# Patient Record
Sex: Female | Born: 1945 | Race: White | Hispanic: No | Smoking: Never smoker
Health system: Southern US, Community
[De-identification: ages and names within clinical notes are randomized; demographics above are authoritative.]

## PROBLEM LIST (undated history)

## (undated) DIAGNOSIS — E119 Type 2 diabetes mellitus without complications: Secondary | ICD-10-CM

## (undated) HISTORY — PX: APPENDECTOMY: SHX54

## (undated) HISTORY — PX: REPLACEMENT TOTAL KNEE: SUR1224

## (undated) HISTORY — PX: TUBAL LIGATION: SHX77

## (undated) HISTORY — PX: TONSILLECTOMY: SUR1361

## (undated) HISTORY — PX: CHOLECYSTECTOMY: SHX55

## (undated) HISTORY — PX: SHOULDER SURGERY: SHX246

---

## 2020-07-10 ENCOUNTER — Other Ambulatory Visit: Payer: Self-pay

## 2020-07-10 ENCOUNTER — Emergency Department (HOSPITAL_BASED_OUTPATIENT_CLINIC_OR_DEPARTMENT_OTHER): Payer: Medicare Other

## 2020-07-10 ENCOUNTER — Emergency Department (HOSPITAL_BASED_OUTPATIENT_CLINIC_OR_DEPARTMENT_OTHER)
Admission: EM | Admit: 2020-07-10 | Discharge: 2020-07-10 | Disposition: A | Discharge status: Home / Self Care | Payer: Medicare Other | Attending: Emergency Medicine | Admitting: Emergency Medicine

## 2020-07-10 ENCOUNTER — Encounter (HOSPITAL_BASED_OUTPATIENT_CLINIC_OR_DEPARTMENT_OTHER): Payer: Self-pay | Admitting: Emergency Medicine

## 2020-07-10 DIAGNOSIS — E119 Type 2 diabetes mellitus without complications: Secondary | ICD-10-CM | POA: Insufficient documentation

## 2020-07-10 DIAGNOSIS — M25511 Pain in right shoulder: Secondary | ICD-10-CM | POA: Diagnosis present

## 2020-07-10 DIAGNOSIS — Z96651 Presence of right artificial knee joint: Secondary | ICD-10-CM | POA: Insufficient documentation

## 2020-07-10 DIAGNOSIS — X501XXA Overexertion from prolonged static or awkward postures, initial encounter: Secondary | ICD-10-CM | POA: Insufficient documentation

## 2020-07-10 HISTORY — DX: Type 2 diabetes mellitus without complications: E11.9

## 2020-07-10 LAB — CBG MONITORING, ED: Glucose-Capillary: 112 mg/dL — ABNORMAL HIGH (ref 70–99)

## 2020-07-10 MED ORDER — HYDROCODONE-ACETAMINOPHEN 5-325 MG PO TABS
1.0000 | ORAL_TABLET | Freq: Once | ORAL | Status: DC
  Filled 2020-07-10: qty 1

## 2020-07-10 MED ORDER — LIDOCAINE 5 % EX PTCH
1.0000 | MEDICATED_PATCH | CUTANEOUS | Status: DC
  Administered 2020-07-10: 1 via TRANSDERMAL
  Filled 2020-07-10: qty 1

## 2020-07-10 MED ORDER — HYDROCODONE-ACETAMINOPHEN 5-325 MG PO TABS: 1.0000 | ORAL_TABLET | Freq: Four times a day (QID) | ORAL | 0 refills | Status: AC | PRN

## 2020-07-10 NOTE — ED Notes (Signed)
ED Provider at bedside, pt hyperventilating, states tingling in hands and legs, states "seeing gray".

## 2020-07-10 NOTE — ED Provider Notes (Addendum)
[facility] EMERGENCY DEPARTMENT Provider Note   CSN: [id number] Arrival date & time: 07/10/20  9735     History Chief Complaint  Patient presents with   Shoulder Pain    Courtney Stanley is a 76 y.o. female with past medical history of primary osteoarthritis and left shoulder surgery who presents the ED with complaints of right shoulder pain x1 month.  I reviewed patient's medical record and she is followed by Dr. Leonel Stanley with [facility].    On my examination, patient reports that she developed right shoulder pain after lifting a mixer above her head out of a cabinet 1 month ago.  Since then she has had repeated pain with overhead arm movement.  Last evening she was taking her shirt off when she developed a sudden worsening in her right shoulder pain.  She could hardly sleep last night due to the pain.  She took 800 mg ibuprofen prior to arrival, with some relief.  She denies any numbness, weakness, falls, fevers or chills, history of autoimmune disease or illicit drug use, or other injuries.  HPI     Past Medical History:  Diagnosis Date   Diabetes mellitus without complication (HCC)     There are no problems to display for this patient.   Past Surgical History:  Procedure Laterality Date   APPENDECTOMY     CHOLECYSTECTOMY     REPLACEMENT TOTAL KNEE Right    SHOULDER SURGERY Left    TONSILLECTOMY     TUBAL LIGATION       OB History   No obstetric history on file.     No family history on file.  Social History   Tobacco Use   Smoking status: Never   Smokeless tobacco: Never  Vaping Use   Vaping Use: Never used  Substance Use Topics   Alcohol use: Not Currently   Drug use: Not Currently    Home Medications Prior to Admission medications   Medication Sig Start Date End Date Taking? Authorizing Provider  HYDROcodone-acetaminophen (NORCO/VICODIN) 5-325 MG tablet Take 1 tablet by mouth every 6 (six) hours as needed for severe pain. 07/10/20  Yes  Courtney New, PA-C    Allergies    Patient has no known allergies.  Review of Systems   Review of Systems  All other systems reviewed and are negative.  Physical Exam Updated Vital Signs BP (!) 188/84 (BP Location: Left Arm)   Pulse 86   Temp 98.4 F (36.9 C) (Oral)   Resp (!) 26   Ht 5\' 4"  (1.626 m)   Wt 62.1 kg   SpO2 100%   BMI 23.52 kg/m   Physical Exam Vitals and nursing note reviewed. Exam conducted with a chaperone present.  Constitutional:      General: She is not in acute distress.    Appearance: Normal appearance. She is not ill-appearing.  HENT:     Head: Normocephalic and atraumatic.  Eyes:     General: No scleral icterus.    Conjunctiva/sclera: Conjunctivae normal.  Cardiovascular:     Rate and Rhythm: Normal rate and regular rhythm.     Pulses: Normal pulses.  Pulmonary:     Effort: Pulmonary effort is normal. No respiratory distress.     Breath sounds: Normal breath sounds.  Musculoskeletal:        General: Tenderness present. No swelling, deformity or signs of injury.     Comments: Right shoulder: Limited right arm movement due to pain over anterior shoulder.  TTP  over deltoid, humeral head, and bicipital groove.  Patient unable to flex arm at elbow due to pain.  Cannot perform any overhead movement.  No redness, deformity, or other overlying skin changes.  Peripheral pulses intact.  Sensation intact throughout.  Compartments are soft.  Cannot attempt indican test or other additional special exams given pain with any ROM.  Skin:    General: Skin is dry.  Neurological:     General: No focal deficit present.     Mental Status: She is alert and oriented to person, place, and time.     GCS: GCS eye subscore is 4. GCS verbal subscore is 5. GCS motor subscore is 6.  Psychiatric:        Mood and Affect: Mood normal.        Behavior: Behavior normal.        Thought Content: Thought content normal.    ED Results / Procedures / Treatments   Labs (all  labs ordered are listed, but only abnormal results are displayed) Labs Reviewed  CBG MONITORING, ED - Abnormal; Notable for the following components:      Result Value   Glucose-Capillary 112 (*)    All other components within normal limits    EKG None  Radiology DG Shoulder Right  Result Date: 07/10/2020 CLINICAL DATA:  Pain in the RIGHT shoulder. Increased since last night. EXAM: RIGHT SHOULDER - 2+ VIEW COMPARISON:  Chest x-ray from 2019. FINDINGS: RIGHT shoulder is located glenohumeral and acromioclavicular degenerative changes are present, mild-to-moderate. Degenerative changes are incidentally noted in the cervical spine, not well assessed. No signs of fracture. Osteopenia. IMPRESSION: Degenerative changes in the RIGHT shoulder without signs of fracture. Electronically Signed   By: Donzetta Kohut M.D.   On: 07/10/2020 11:09    Procedures Procedures   Medications Ordered in ED Medications  lidocaine (LIDODERM) 5 % 1 patch (1 patch Transdermal Patch Applied 07/10/20 1049)  HYDROcodone-acetaminophen (NORCO/VICODIN) 5-325 MG per tablet 1 tablet (1 tablet Oral Not Given 07/10/20 1214)    ED Course  I have reviewed the triage vital signs and the nursing notes.  Pertinent labs & imaging results that were available during my care of the patient were reviewed by me and considered in my medical decision making (see chart for details).    MDM Rules/Calculators/A&P                          Courtney Stanley was evaluated in Emergency Department on 07/10/2020 for the symptoms described in the history of present illness. She was evaluated in the context of the global COVID-19 pandemic, which necessitated consideration that the patient might be at risk for infection with the SARS-CoV-2 virus that causes COVID-19. Institutional protocols and algorithms that pertain to the evaluation of patients at risk for COVID-19 are in a state of rapid change based on information released by regulatory bodies  including the CDC and federal and state organizations. These policies and algorithms were followed during the patient's care in the ED.  I personally reviewed patient's medical chart and all notes from triage and staff during today's encounter. I have also ordered and reviewed all labs and imaging that I felt to be medically necessary in the evaluation of this patient's complaints and with consideration of their physical exam. If needed, translation services were available and utilized.   Patient in the ED with right shoulder pain, provoked by overhead movement.  She has worsening right shoulder pain with  any movement, particularly lifting the arm.    She first sustained injury approximately 1 month ago and had been having waxing waning discomfort, but became acutely worse last evening when taking her shirt off.  She is functionally limited due to pain, but neurovascularly intact.  Plain films right shoulder personally reviewed and demonstrate degenerative changes, but without evidence of fracture or other acute arthropathy.  Lidoderm patch was applied, but she did not like the way that it made her feel.  We will discontinue.  Instead, will discharge home with short course of Vicodin which she has taken in the past with good effect.  Recommending close outpatient follow-up with her orthopedist, Dr. Leonel Stanley, for ongoing evaluation and management.  She likely would benefit from MRI to evaluate for cuff injury.  ER return precautions discussed.  Patient voices understanding is agreeable to the plan.   Addendum: Patient had reaction comfortable to anxiety attack at time of discharge.  She was hyperventilating and complaining of bilateral peripheral tingling.  She states that she is very sensitive to medications and suspects that it was related to the Lidoderm administration in the ED.  On subsequent evaluation, she had improved and is now back at her baseline.  Likely was panic attack related to her pain  symptoms or reaction to Lidoderm.  There is no evidence to suggest allergic reaction however.  No hives, oropharyngeal swelling, or wheezing/stridor.  She feels improved and ready for discharge.  Final Clinical Impression(s) / ED Diagnoses Final diagnoses:  Acute pain of right shoulder    Rx / DC Orders ED Discharge Orders          Ordered    HYDROcodone-acetaminophen (NORCO/VICODIN) 5-325 MG tablet  Every 6 hours PRN        07/10/20 1124             Elvera Maria 07/10/20 1125    Pollyann Savoy, MD 07/10/20 1202    Courtney New, PA-C 07/10/20 1325    Pollyann Savoy, MD 07/10/20 1439

## 2020-07-10 NOTE — ED Notes (Signed)
ED Provider at bedside. 

## 2020-07-10 NOTE — ED Notes (Signed)
Patient transported to X-ray 

## 2020-07-10 NOTE — Discharge Instructions (Addendum)
Your x-rays reviewed were without any acute bony abnormalities.  However, degenerative (arthritic) changes were noted and x-rays cannot exclude tendinous or ligamentous involvement.  Concern for rotator cuff injury.  He would likely benefit from MRI.  Please call orthopedist tomorrow to schedule appointment for close outpatient follow-up.  Recommend continued over-the-counter medications as needed for pain control.  I have also prescribed you Vicodin which should at least help with your discomfort.  Return to the ER seek immediate attention should you experience any numbness, worsening pain, swelling, or any other new or worsening symptoms.

## 2020-07-10 NOTE — ED Notes (Signed)
Pt asked for CBG to be checked, felt shaky

## 2020-07-10 NOTE — ED Notes (Signed)
Pain in right shoulder, limited ROM, states took Ibuprofen 800mg  at 0830, with minimal reflief.

## 2020-07-10 NOTE — ED Triage Notes (Signed)
Pt c/o right shoulder pain x 1 month that increased last night after taking her shirt off. Pt denies injury.

## 2021-12-04 IMAGING — DX DG SHOULDER 2+V*R*
3 series · 3 of 3 positions shown · non-contrast
Comparison: Chest x-ray from 6736.

CLINICAL DATA: Pain in the RIGHT shoulder. Increased since last
night.

EXAM:
RIGHT SHOULDER - 2+ VIEW

[shoulder grashey]
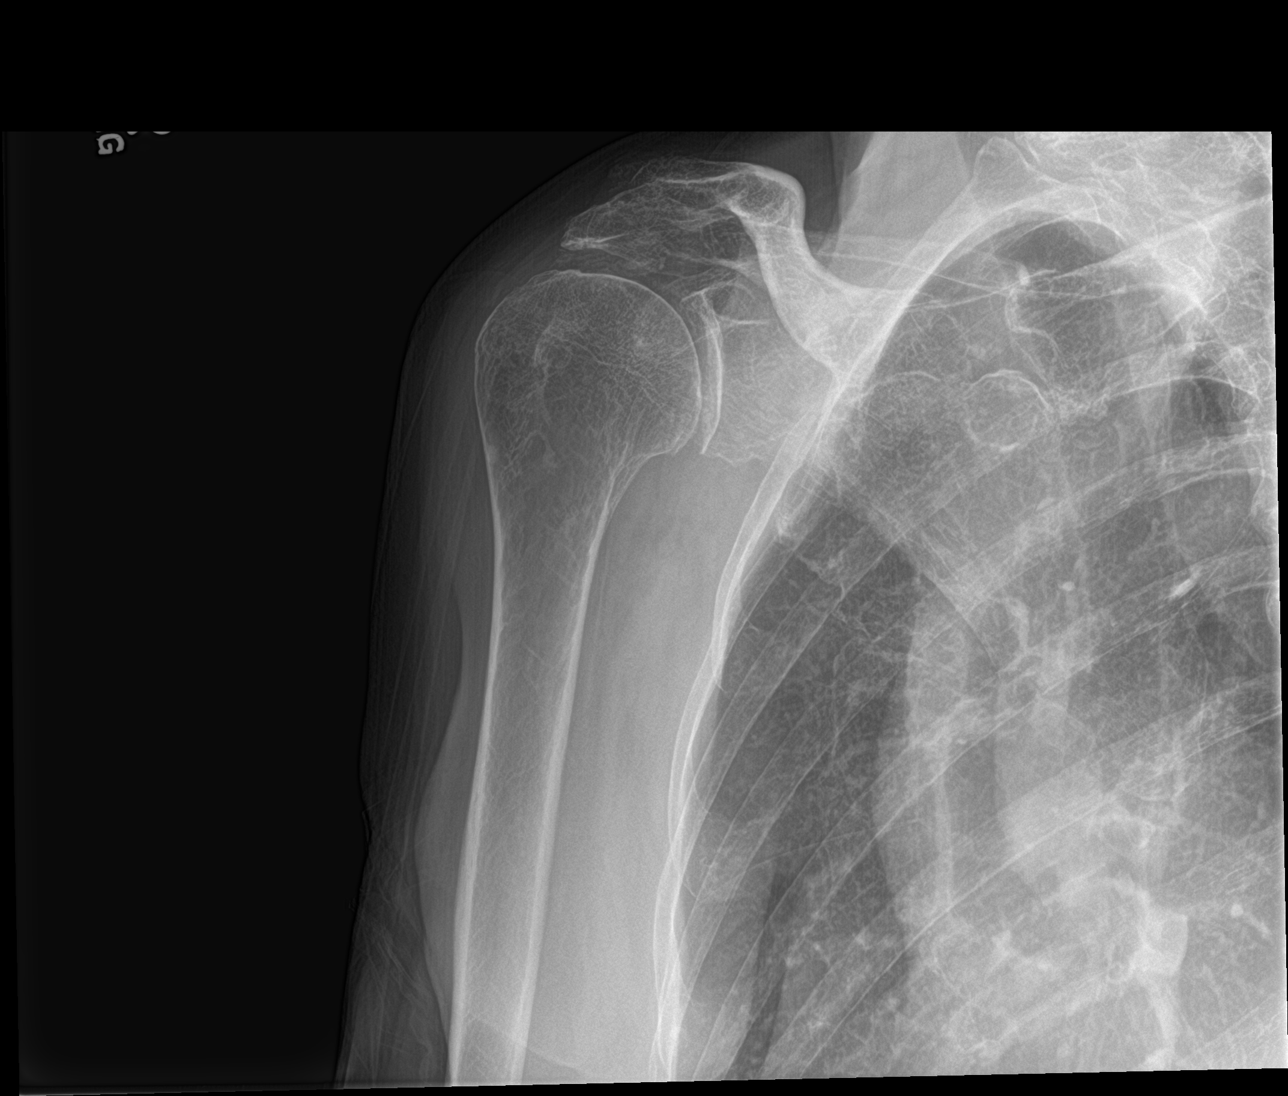

[shoulder y view]
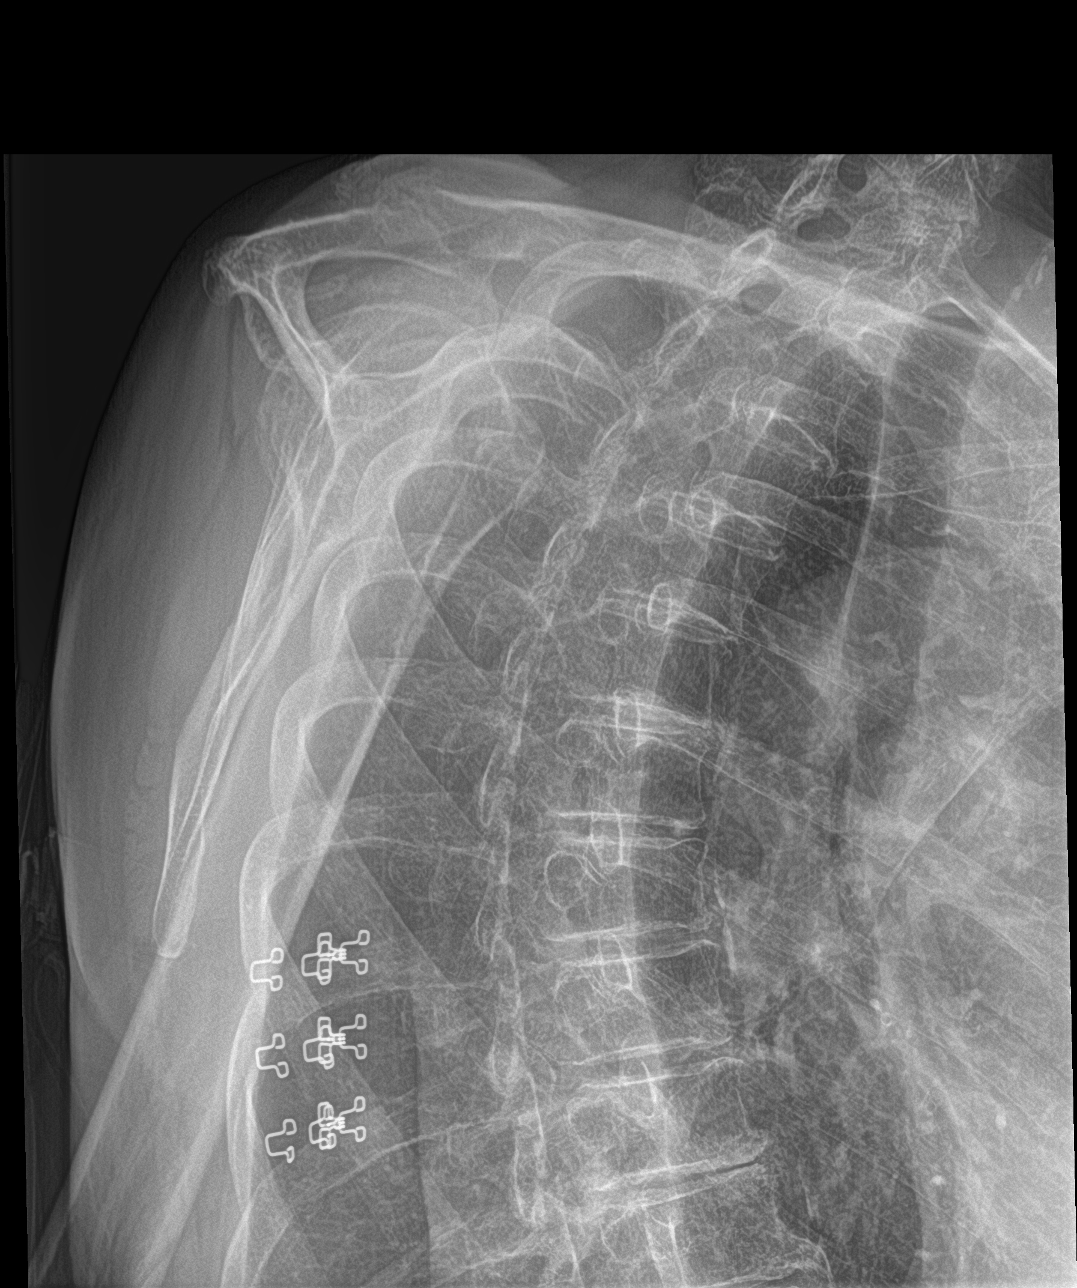

[shoulder axillary]
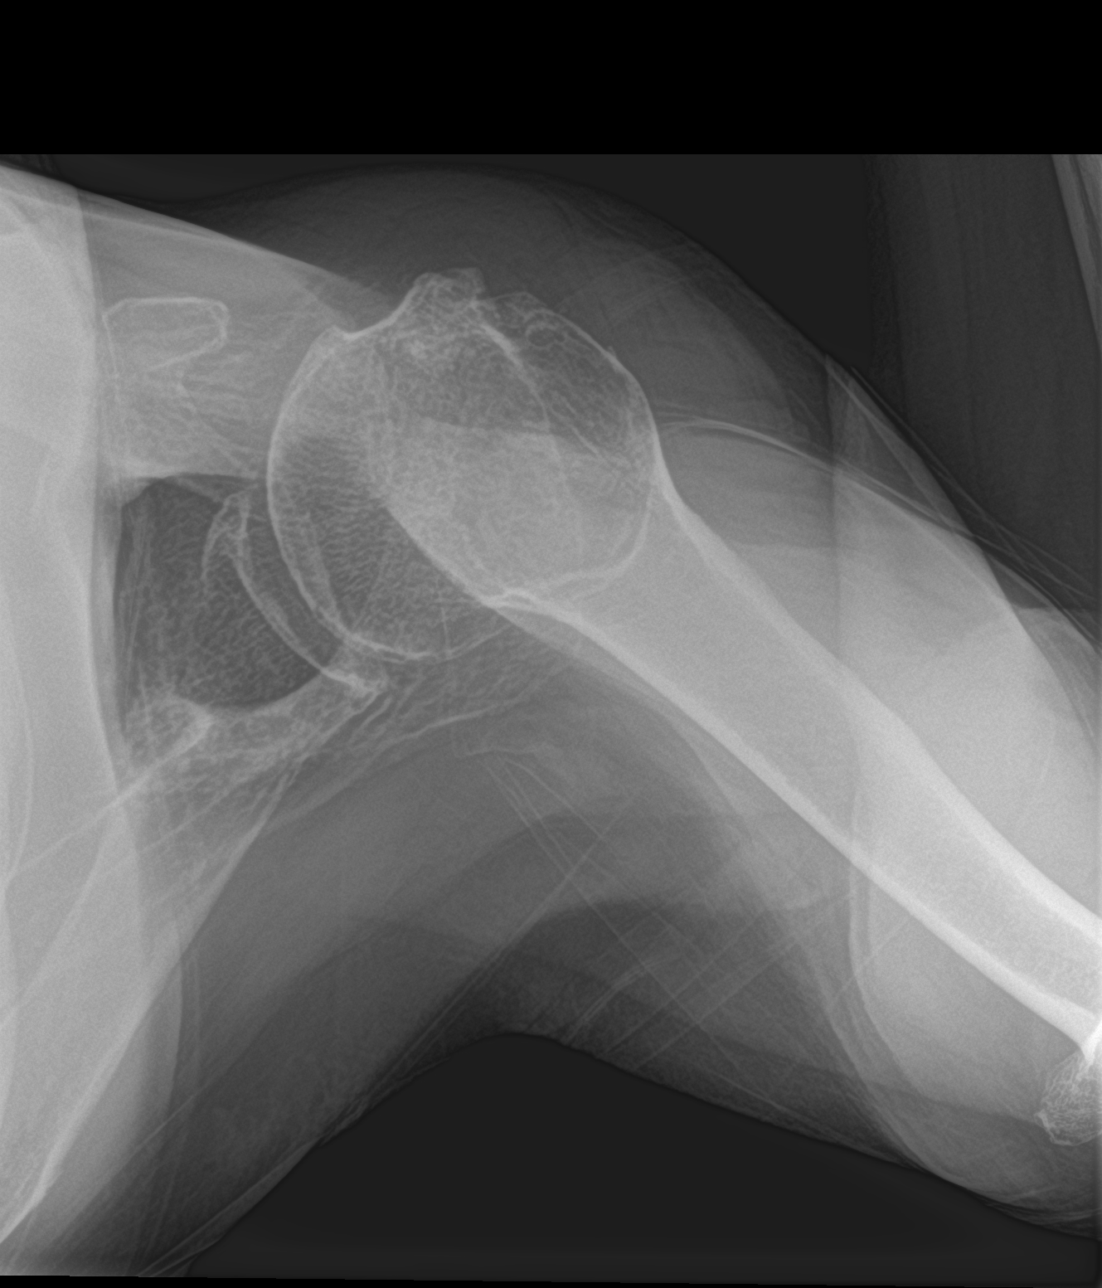

[3 of 3 positions shown; findings below may reference images not displayed]

FINDINGS: RIGHT shoulder is located glenohumeral and acromioclavicular
degenerative changes are present, mild-to-moderate. Degenerative
changes are incidentally noted in the cervical spine, not well
assessed. No signs of fracture. Osteopenia.
IMPRESSION: Degenerative changes in the RIGHT shoulder without signs of
fracture.
